# Patient Record
Sex: Female | Born: 2000 | Race: White | Hispanic: No | Marital: Single | Smoking: Never smoker
Health system: Southern US, Community
[De-identification: ages and names within clinical notes are randomized; demographics above are authoritative.]

---

## 2018-06-11 ENCOUNTER — Emergency Department (HOSPITAL_COMMUNITY)
Admission: EM | Admit: 2018-06-11 | Discharge: 2018-06-11 | Disposition: A | Discharge status: Home / Self Care | Payer: Self-pay | Attending: Emergency Medicine | Admitting: Emergency Medicine

## 2018-06-11 ENCOUNTER — Emergency Department (HOSPITAL_COMMUNITY): Payer: Self-pay

## 2018-06-11 ENCOUNTER — Encounter (HOSPITAL_COMMUNITY): Payer: Self-pay | Admitting: Emergency Medicine

## 2018-06-11 DIAGNOSIS — J069 Acute upper respiratory infection, unspecified: Secondary | ICD-10-CM | POA: Insufficient documentation

## 2018-06-11 LAB — GROUP A STREP BY PCR: Group A Strep by PCR: NOT DETECTED

## 2018-06-11 MED ORDER — BENZONATATE 100 MG PO CAPS: 100.0000 mg | ORAL_CAPSULE | Freq: Three times a day (TID) | ORAL | 0 refills | Status: AC

## 2018-06-11 MED ORDER — IBUPROFEN 800 MG PO TABS: 800.0000 mg | ORAL_TABLET | Freq: Three times a day (TID) | ORAL | 0 refills | Status: AC

## 2018-06-11 MED ORDER — FLUTICASONE PROPIONATE 50 MCG/ACT NA SUSP: 1.0000 | Freq: Every day | NASAL | 0 refills | Status: AC

## 2018-06-11 NOTE — Discharge Instructions (Addendum)
You were seen in the emergency today for upper respiratory infection type symptoms.  Your strep test was negative.  Your chest x-ray was normal.  We suspect your symptoms are allergic or viral in nature.  I have prescribed you multiple medications to treat your symptoms.  ° °-Flonase to be used 1 spray in each nostril daily.  This medication is used to treat your congestion. ° °-Tessalon can be taken once every 8 hours as needed.  This medication is used to treat your cough. ° °-Ibuprofen to be taken once every 8 hours as needed for pain. ° °We have prescribed you new medication(s) today. Discuss the medications prescribed today with your pharmacist as they can have adverse effects and interactions with your other medicines including over the counter and prescribed medications. Seek medical evaluation if you start to experience new or abnormal symptoms after taking one of these medicines, seek care immediately if you start to experience difficulty breathing, feeling of your throat closing, facial swelling, or rash as these could be indications of a more serious allergic reaction ° ° °You will need to follow-up with your primary care provider in 1 week if your symptoms have not improved.  If you do not have a primary care provider one is provided in your discharge instructions.  Return to the emergency department for any new or worsening symptoms including but not limited to persistent fever for 5 days, difficulty breathing, chest pain, passing out.=, or any other concerns.  ° °

## 2018-06-11 NOTE — ED Provider Notes (Signed)
[facility]-EMERGENCY DEPT Provider Note   CSN: [id number] Arrival date & time: 06/11/18  1109     History   Chief Complaint Chief Complaint  Patient presents with  . Sore Throat  . Cough  . Nasal Congestion    HPI Natasha Baker is a 17 y.o. female without significant past medical hx who presents to the ED with complaints of URI symptoms which have been progressively worsening over the past 3 days.  Patient states she is having congestion, rhinorrhea, sinus pressure, sore throat, and cough productive with mucus sputum.  She has tried DayQuil with some improvement.  No other specific alleviating or aggravating factors.  Her fianc is sick with similar symptoms.  Denies fever, vomiting, abdominal pain, chest pain, dyspnea, or recent tick exposure.  HPI  History reviewed. No pertinent past medical history.  There are no active problems to display for this patient.   History reviewed. No pertinent surgical history.   OB History   None      Home Medications    Prior to Admission medications   Medication Sig Start Date End Date Taking? Authorizing Provider  naphazoline-glycerin (CLEAR EYES REDNESS) 0.012-0.2 % SOLN Place 1-2 drops into both eyes 4 (four) times daily as needed for eye irritation (itchiness).   Yes [provider]  Pseudoephedrine-APAP-DM (DAYQUIL PO) Take 30 mLs by mouth daily.   Yes [provider]    Family History History reviewed. No pertinent family history.  Social History Social History   Tobacco Use  . Smoking status: Never Smoker  . Smokeless tobacco: Never Used  Substance Use Topics  . Alcohol use: Not on file  . Drug use: Not on file     Allergies   Shrimp [shellfish allergy]   Review of Systems Review of Systems  Constitutional: Negative for chills and fever.  HENT: Positive for congestion, rhinorrhea, sinus pressure and sore throat. Negative for drooling, ear discharge, ear pain and trouble  swallowing.   Respiratory: Positive for cough. Negative for shortness of breath.   Cardiovascular: Negative for chest pain.  Gastrointestinal: Negative for abdominal pain and vomiting.  Skin: Negative for rash.     Physical Exam Updated Vital Signs BP 119/66 (BP Location: Right Arm)   Pulse 98   Temp 98.4 F (36.9 C) (Oral)   Resp 17   Ht 5\' 4"  (1.626 m)   Wt 81.6 kg (180 lb)   LMP 05/28/2018   SpO2 98%   BMI 30.90 kg/m   Physical Exam  Constitutional: She appears well-developed and well-nourished.  Non-toxic appearance. No distress.  HENT:  Head: Normocephalic and atraumatic.  Right Ear: Tympanic membrane normal. No drainage. No mastoid tenderness. Tympanic membrane is not perforated, not erythematous, not retracted and not bulging.  Left Ear: Tympanic membrane normal. No drainage. No mastoid tenderness. Tympanic membrane is not perforated, not erythematous, not retracted and not bulging.  Nose: Mucosal edema present. Right sinus exhibits no maxillary sinus tenderness and no frontal sinus tenderness. Left sinus exhibits no maxillary sinus tenderness and no frontal sinus tenderness.  Mouth/Throat: Uvula is midline. No uvula swelling. Oropharyngeal exudate and posterior oropharyngeal erythema present. Tonsils are 2+ on the right. Tonsils are 2+ on the left.  Patient is tolerating her own secretions without difficulty.  No trismus.  No drooling.  No hot potato voice.  Submandibular compartment is soft.  Eyes: Pupils are equal, round, and reactive to light. Conjunctivae are normal. Right eye exhibits no discharge. Left eye exhibits no  discharge.  Neck: Normal range of motion. Neck supple. No neck rigidity.  Cardiovascular: Normal rate and regular rhythm.  No murmur heard. Pulmonary/Chest: Breath sounds normal. No respiratory distress. She has no wheezes. She has no rales.  Abdominal: Soft. She exhibits no distension. There is no tenderness.  Lymphadenopathy:    She has cervical  adenopathy (Bilateral anterior, mild).  Neurological: She is alert.  Skin: Skin is warm and dry. No rash noted.  Psychiatric: She has a normal mood and affect. Her behavior is normal. Thought content normal.  Nursing note and vitals reviewed.    ED Treatments / Results  Labs Results for orders placed or performed during the hospital encounter of 06/11/18  Group A Strep by PCR  Result Value Ref Range   Group A Strep by PCR NOT DETECTED NOT DETECTED   Dg Chest 2 View  Result Date: 06/11/2018 CLINICAL DATA:  Cough and congestion EXAM: CHEST - 2 VIEW COMPARISON:  None. FINDINGS: Lungs are clear. Heart size and pulmonary vascularity are normal. No adenopathy. No bone lesions. IMPRESSION: No edema or consolidation. Electronically Signed   By: Bretta BangWilliam  Woodruff III M.D.   On: 06/11/2018 13:04   EKG None  Radiology Dg Chest 2 View  Result Date: 06/11/2018 CLINICAL DATA:  Cough and congestion EXAM: CHEST - 2 VIEW COMPARISON:  None. FINDINGS: Lungs are clear. Heart size and pulmonary vascularity are normal. No adenopathy. No bone lesions. IMPRESSION: No edema or consolidation. Electronically Signed   By: Bretta BangWilliam  Woodruff III M.D.   On: 06/11/2018 13:04    Procedures Procedures (including critical care time)  Medications Ordered in ED Medications - No data to display   Initial Impression / Assessment and Plan / ED Course  I have reviewed the triage vital signs and the nursing notes.  Pertinent labs & imaging results that were available during my care of the patient were reviewed by me and considered in my medical decision making (see chart for details).   Patient presents to the emergency department with URI symptoms for the past few days.  Patient nontoxic-appearing, no apparent distress, vitals without significant abnormality.  Patient's lungs are clear to auscultation bilaterally, chest x-ray negative for infiltrate, doubt pneumonia.  No evidence of respiratory distress or wheezing.   Strep test obtained and negative, no evidence of RPA/PTA.  Given symptoms ongoing for >7 days, afebrile, doubt acute bacterial sinusitis. No evidence of AOM. No meningeal signs. No tic exposure to raise concern for tic borne illness. Suspect viral vs. Allergic, will treat supportively with flonase, tessalon, and ibuprofen with PCP follow up. I discussed results, treatment plan, need for PCP follow-up, and return precautions with the patient. Provided opportunity for questions, patient confirmed understanding and is in agreement with plan.    Final Clinical Impressions(s) / ED Diagnoses   Final diagnoses:  URI with cough and congestion    ED Discharge Orders        Ordered    ibuprofen (ADVIL,MOTRIN) 800 MG tablet  3 times daily     06/11/18 1327    benzonatate (TESSALON) 100 MG capsule  Every 8 hours     06/11/18 1327    fluticasone (FLONASE) 50 MCG/ACT nasal spray  Daily     06/11/18 1327       Rodd Heft, [place] R, PA-C 06/11/18 1459    Terrilee FilesButler, Michael C, MD 06/13/18 1104

## 2018-06-11 NOTE — ED Triage Notes (Signed)
Pt repots that for couple days had sore throat, cough, congestion.

## 2019-02-25 IMAGING — CR DG CHEST 2V
2 series · 2 of 2 positions shown · non-contrast
Comparison: None.

CLINICAL DATA: Cough and congestion

EXAM:
CHEST - 2 VIEW

[w chest pa]
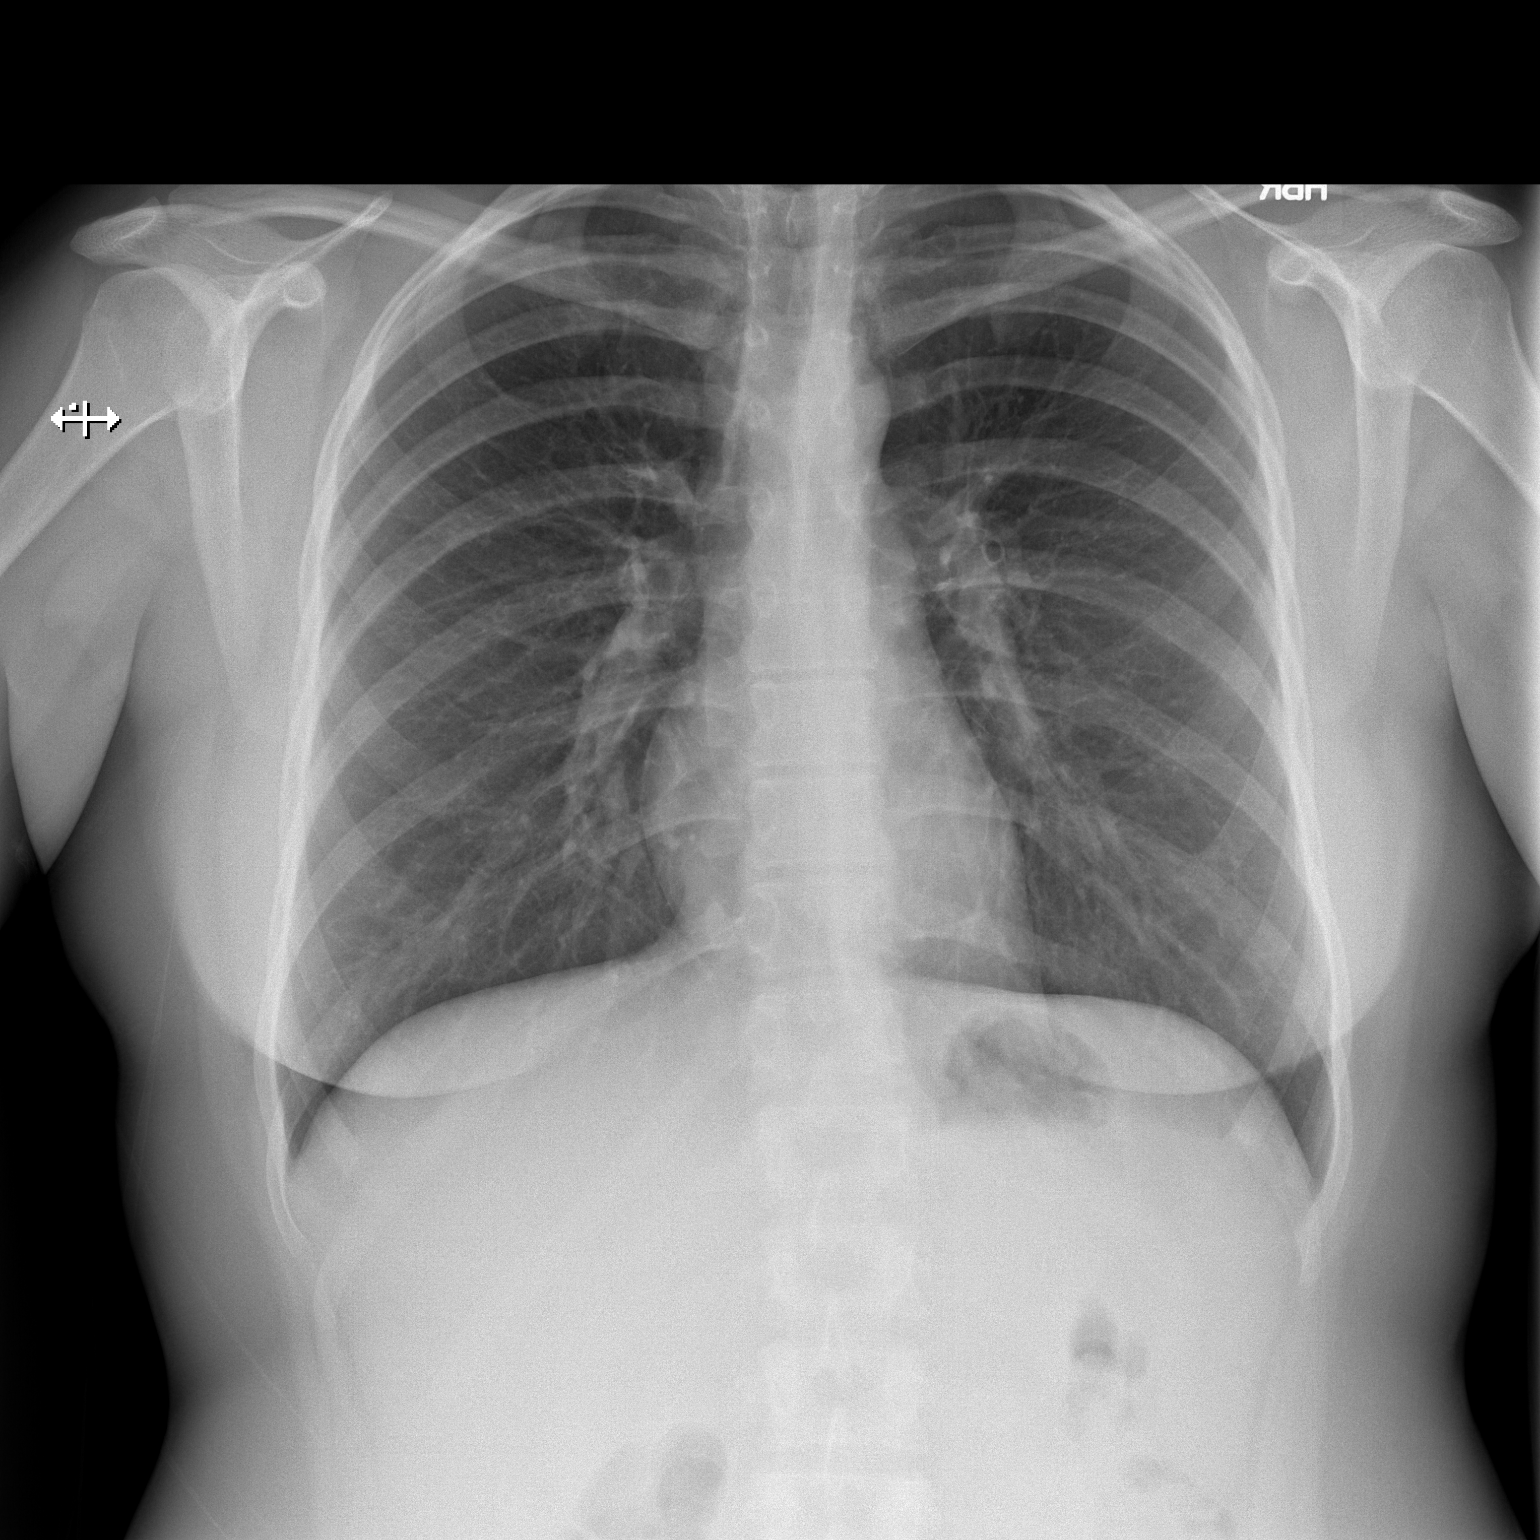

[w chest lat]
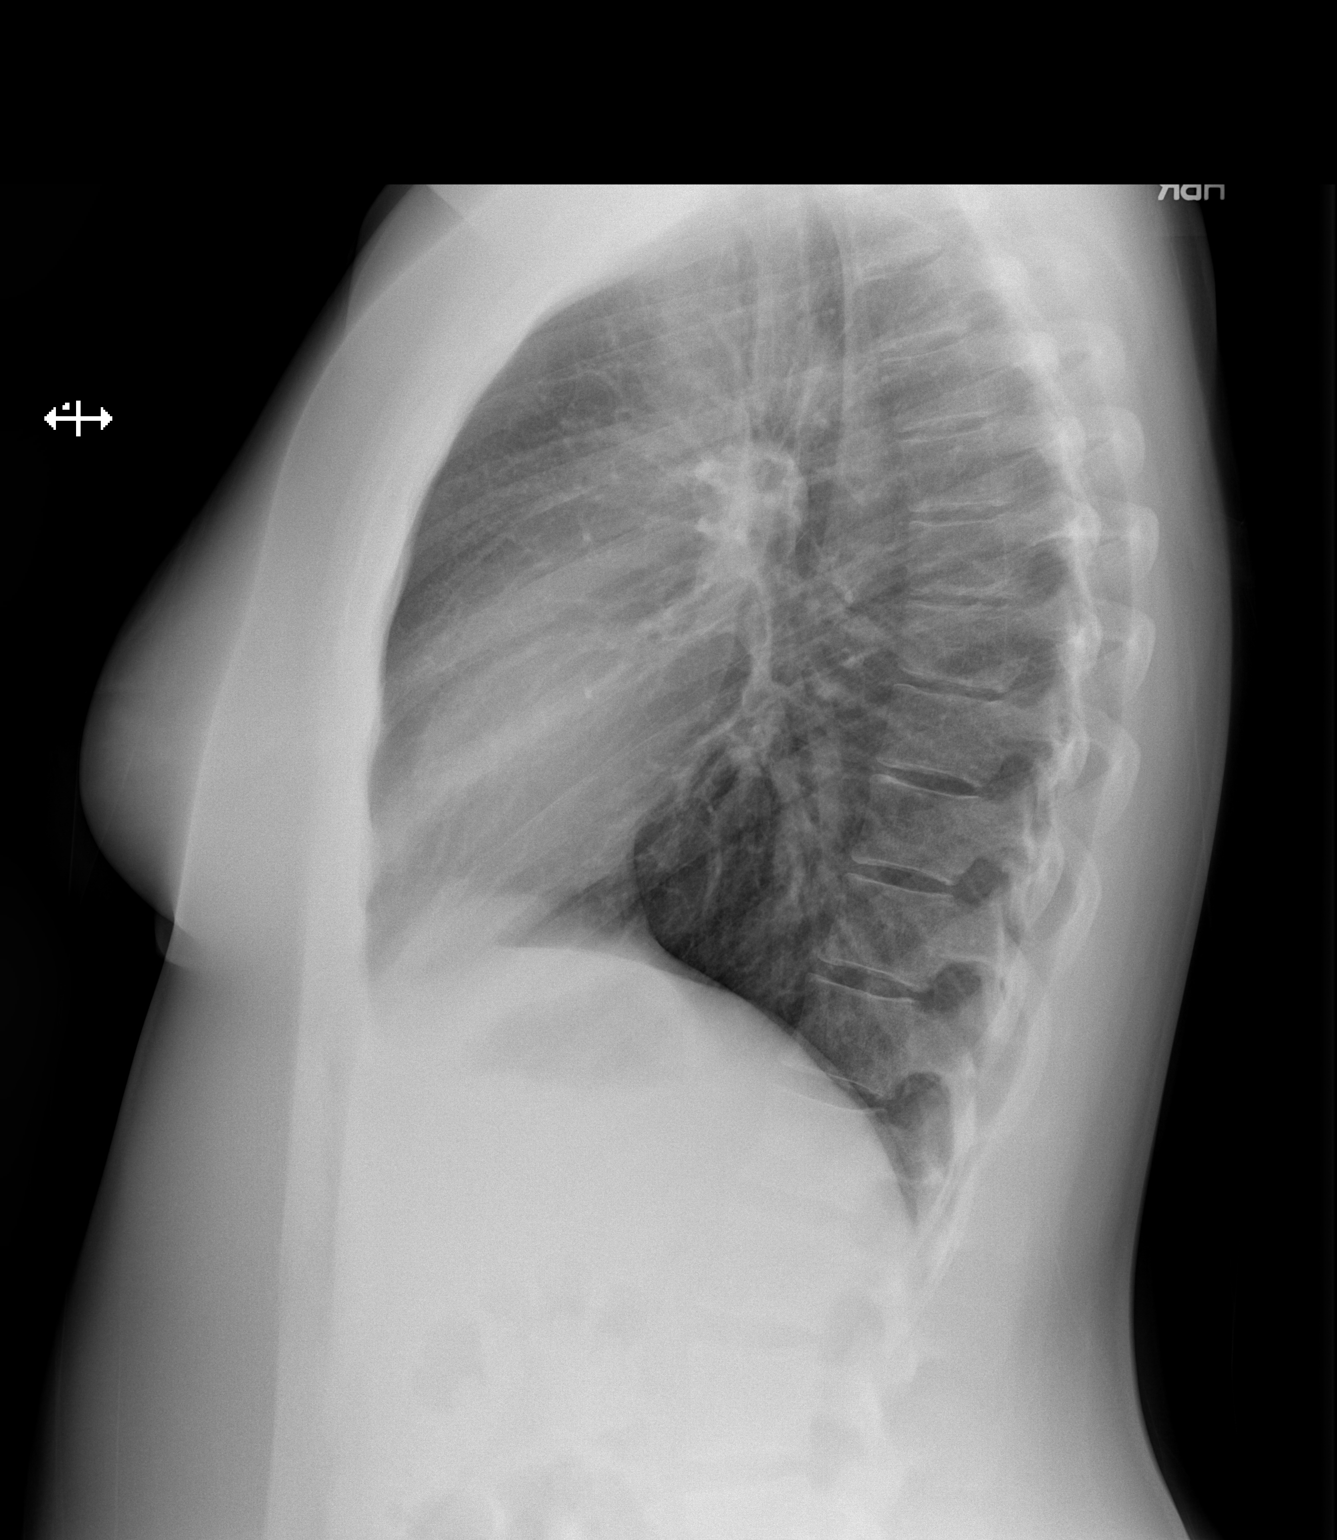

[2 of 2 positions shown; findings below may reference images not displayed]

FINDINGS: Lungs are clear. Heart size and pulmonary vascularity are normal. No
adenopathy. No bone lesions.
IMPRESSION: No edema or consolidation.
